# Patient Record
Sex: Female | Born: 1975 | Race: White | Hispanic: No | Marital: Married
Health system: Southern US, Community
[De-identification: ages and names within clinical notes are randomized; demographics above are authoritative.]

---

## 2018-05-30 ENCOUNTER — Other Ambulatory Visit: Payer: Self-pay

## 2018-05-30 ENCOUNTER — Emergency Department (HOSPITAL_COMMUNITY): Payer: BLUE CROSS/BLUE SHIELD

## 2018-05-30 ENCOUNTER — Emergency Department (HOSPITAL_COMMUNITY)
Admission: EM | Admit: 2018-05-30 | Discharge: 2018-05-30 | Disposition: A | Discharge status: Home / Self Care | Payer: BLUE CROSS/BLUE SHIELD | Attending: Emergency Medicine | Admitting: Emergency Medicine

## 2018-05-30 ENCOUNTER — Encounter (HOSPITAL_COMMUNITY): Payer: Self-pay | Admitting: Emergency Medicine

## 2018-05-30 DIAGNOSIS — S301XXA Contusion of abdominal wall, initial encounter: Secondary | ICD-10-CM | POA: Insufficient documentation

## 2018-05-30 DIAGNOSIS — Y9389 Activity, other specified: Secondary | ICD-10-CM | POA: Insufficient documentation

## 2018-05-30 DIAGNOSIS — Y998 Other external cause status: Secondary | ICD-10-CM | POA: Insufficient documentation

## 2018-05-30 DIAGNOSIS — S3991XA Unspecified injury of abdomen, initial encounter: Secondary | ICD-10-CM | POA: Diagnosis present

## 2018-05-30 DIAGNOSIS — Z79899 Other long term (current) drug therapy: Secondary | ICD-10-CM | POA: Diagnosis not present

## 2018-05-30 DIAGNOSIS — M533 Sacrococcygeal disorders, not elsewhere classified: Secondary | ICD-10-CM | POA: Insufficient documentation

## 2018-05-30 DIAGNOSIS — M791 Myalgia, unspecified site: Secondary | ICD-10-CM | POA: Insufficient documentation

## 2018-05-30 DIAGNOSIS — Y9289 Other specified places as the place of occurrence of the external cause: Secondary | ICD-10-CM | POA: Diagnosis not present

## 2018-05-30 DIAGNOSIS — H9190 Unspecified hearing loss, unspecified ear: Secondary | ICD-10-CM

## 2018-05-30 LAB — DIFFERENTIAL
Abs Immature Granulocytes: 0.1 10*3/uL (ref 0.0–0.1)
BASOS PCT: 0 %
Basophils Absolute: 0 10*3/uL (ref 0.0–0.1)
EOS ABS: 0 10*3/uL (ref 0.0–0.7)
Eosinophils Relative: 0 %
Immature Granulocytes: 1 %
Lymphocytes Relative: 7 %
Lymphs Abs: 0.9 10*3/uL (ref 0.7–4.0)
MONOS PCT: 6 %
Monocytes Absolute: 0.8 10*3/uL (ref 0.1–1.0)
NEUTROS ABS: 11.3 10*3/uL — AB (ref 1.7–7.7)
NEUTROS PCT: 86 %

## 2018-05-30 LAB — BASIC METABOLIC PANEL
ANION GAP: 7 (ref 5–15)
BUN: 10 mg/dL (ref 6–20)
CHLORIDE: 110 mmol/L (ref 101–111)
CO2: 21 mmol/L — ABNORMAL LOW (ref 22–32)
Calcium: 8.6 mg/dL — ABNORMAL LOW (ref 8.9–10.3)
Creatinine, Ser: 0.8 mg/dL (ref 0.44–1.00)
GFR calc Af Amer: >60 mL/min (ref >60)
Glucose, Bld: 96 mg/dL (ref 65–99)
POTASSIUM: 4.5 mmol/L (ref 3.5–5.1)
SODIUM: 138 mmol/L (ref 135–145)

## 2018-05-30 LAB — CBC
HCT: 42 % (ref 36.0–46.0)
HEMOGLOBIN: 13.6 g/dL (ref 12.0–15.0)
MCH: 30.4 pg (ref 26.0–34.0)
MCHC: 32.4 g/dL (ref 30.0–36.0)
MCV: 93.8 fL (ref 78.0–100.0)
Platelets: 230 10*3/uL (ref 150–400)
RBC: 4.48 MIL/uL (ref 3.87–5.11)
RDW: 13.1 % (ref 11.5–15.5)
WBC: 13.2 10*3/uL — AB (ref 4.0–10.5)

## 2018-05-30 LAB — I-STAT BETA HCG BLOOD, ED (MC, WL, AP ONLY): I-stat hCG, quantitative: <5 m[IU]/mL (ref <5)

## 2018-05-30 LAB — URINALYSIS, ROUTINE W REFLEX MICROSCOPIC
Bilirubin Urine: NEGATIVE
Glucose, UA: NEGATIVE mg/dL
Hgb urine dipstick: NEGATIVE
Ketones, ur: 5 mg/dL — AB
LEUKOCYTES UA: NEGATIVE
NITRITE: NEGATIVE
PH: 5 (ref 5.0–8.0)
Protein, ur: NEGATIVE mg/dL
SPECIFIC GRAVITY, URINE: 1.014 (ref 1.005–1.030)

## 2018-05-30 MED ORDER — IOHEXOL 300 MG/ML  SOLN
100.0000 mL | Freq: Once | INTRAMUSCULAR | Status: AC | PRN
  Administered 2018-05-30: 100 mL via INTRAVENOUS

## 2018-05-30 MED ORDER — IBUPROFEN 400 MG PO TABS
400.0000 mg | ORAL_TABLET | Freq: Once | ORAL | Status: AC
  Administered 2018-05-30: 400 mg via ORAL
  Filled 2018-05-30: qty 1
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
## 2018-05-30 NOTE — ED Notes (Signed)
Patient transported to CT 
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
